# Patient Record
Sex: Female | Born: 1975 | Race: Black or African American | Hispanic: No | Marital: Married | Smoking: Never smoker
Health system: Southern US, Community
[De-identification: ages and names within clinical notes are randomized; demographics above are authoritative.]

## PROBLEM LIST (undated history)

## (undated) HISTORY — PX: ABDOMINAL HYSTERECTOMY: SHX81

---

## 2005-02-04 ENCOUNTER — Other Ambulatory Visit: Admission: RE | Admit: 2005-02-04 | Discharge: 2005-02-04 | Payer: Self-pay | Admitting: Obstetrics and Gynecology

## 2005-10-10 ENCOUNTER — Emergency Department (HOSPITAL_COMMUNITY): Admission: EM | Admit: 2005-10-10 | Discharge: 2005-10-10 | Payer: Self-pay | Admitting: *Deleted

## 2006-10-11 ENCOUNTER — Other Ambulatory Visit: Admission: RE | Admit: 2006-10-11 | Discharge: 2006-10-11 | Payer: Self-pay | Admitting: Obstetrics and Gynecology

## 2006-11-07 ENCOUNTER — Encounter: Admission: RE | Admit: 2006-11-07 | Discharge: 2006-11-07 | Payer: Self-pay | Admitting: Gastroenterology

## 2006-12-21 ENCOUNTER — Encounter (INDEPENDENT_AMBULATORY_CARE_PROVIDER_SITE_OTHER): Payer: Self-pay | Admitting: Obstetrics and Gynecology

## 2006-12-22 ENCOUNTER — Inpatient Hospital Stay (HOSPITAL_COMMUNITY): Admission: RE | Admit: 2006-12-22 | Discharge: 2006-12-23 | Payer: Self-pay | Admitting: Obstetrics and Gynecology

## 2007-03-27 ENCOUNTER — Emergency Department (HOSPITAL_COMMUNITY): Admission: EM | Admit: 2007-03-27 | Discharge: 2007-03-27 | Payer: Self-pay | Admitting: *Deleted

## 2007-04-24 ENCOUNTER — Encounter: Admission: RE | Admit: 2007-04-24 | Discharge: 2007-04-24 | Payer: Self-pay | Admitting: Orthopedic Surgery

## 2007-05-10 ENCOUNTER — Encounter: Admission: RE | Admit: 2007-05-10 | Discharge: 2007-05-24 | Payer: Self-pay | Admitting: Orthopedic Surgery

## 2007-05-25 ENCOUNTER — Encounter: Admission: RE | Admit: 2007-05-25 | Discharge: 2007-06-07 | Payer: Self-pay | Admitting: Orthopedic Surgery

## 2009-06-04 ENCOUNTER — Emergency Department (HOSPITAL_COMMUNITY): Admission: EM | Admit: 2009-06-04 | Discharge: 2009-06-04 | Payer: Self-pay | Admitting: Emergency Medicine

## 2010-10-06 NOTE — Op Note (Signed)
Lydia Ramirez, Lydia Ramirez          ACCOUNT NO.:  [id number]   MEDICAL RECORD NO.:  [id number]          PATIENT TYPE:  OIB   LOCATION:  9307                          FACILITY:  [facility]   PHYSICIAN:  James A. Ashley Royalty, M.D.DATE OF BIRTH:  [birth year]   DATE OF PROCEDURE:  12/21/2006  DATE OF DISCHARGE:                               OPERATIVE REPORT   PREOPERATIVE DIAGNOSIS:  1. Pelvic pain.  2. Dysmenorrhea - severe, unresponsive to medical management.   POSTOPERATIVE DIAGNOSIS:  1. Pelvic pain.  2. Dysmenorrhea - severe, unresponsive to medical management.  3. Path pending.   PROCEDURE:  Laparoscopically assisted vaginal hysterectomy.   SURGEON:  Sylvester Harder, M.D.   ASSISTANT:  Miguel Aschoff, M.D.   ANESTHESIA:  General   ESTIMATED BLOOD LOSS:  200 mL.   PACKS AND DRAINS:  Foley.  Needle, sponge, and instrument counts correct  x2.   PROCEDURE:  The patient was taken to the operating room, placed in the  dorsal supine position.  After the general anesthetic was administered  she was placed in the lithotomy position and prepped and draped in usual  manner for abdominal and vaginal surgery.  Posterior weighted retractor  was placed per vagina.  The Hulka tenaculum was placed on the cervix.  The bladder was drained with a Foley catheter which was left in place  throughout the procedure.   A 1.2 cm infraumbilical incision was made in the transverse plane.  Veress needle was inserted into the abdominal cavity.  Location verified  by instillation of saline and hanging drop techniques.  Approximately 3  liters CO2 were instilled to create a good pneumoperitoneum.  Next size  10/11 disposable laparoscopic trocar was placed into the abdominal  cavity.  The location verified by placement laparoscope.  There is no  evidence of any trauma.  Pneumoperitoneum was maintained throughout with  CO2.  Next two 5 mm ports were placed in the left and right lower  quadrants respectively using  transillumination and direct visualization  techniques.  The pelvis was inspected.  The uterus was normal size,  shape and contour without evidence of any significant fibroids or  endometriosis.  The fallopian tubes were normal size, shape and contour  and length bilaterally.  The left ovary was normal size, shape and  contour without evidence of any cysts or endometriosis.  The right ovary  was surgically absent.  Using the gyrus the round ligaments and  fallopian tubes were cauterized and divided bilaterally.  The utero-  ovarian ligaments were similarly cauterized and divided bilaterally.  Hemostasis was noted.  This point the laparoscopic portion of the  procedure was suspended.   Attention was then turned to the vaginal portion of the procedure.  Posterior weighted retractor was placed per vagina.  A Jacobs tenaculum  was applied to the cervix anteriorly.  Approximately 15 mL of 1%  Xylocaine with epinephrine were instilled circumferentially over the  cervix to create additional hemostasis.  The cervix was then  circumscribed with a knife.  Posterior colpotomy incision was made and  the Steiner-Avard retractor was placed in the posterior cul-de-sac.  The  uterosacral  ligaments were clamped, cut and secured bilaterally with 0  Vicryl.  The anterior cul-de-sac was entered successfully.  Next, the  cardinal ligaments were clamped, cut and secured bilaterally with 0  Vicryl.  The uterine vessels were then clamped, cut and secured  bilaterally with 0 Vicryl.  Next the posterior aspect of corpus was  grasped with a single-tooth tenaculum and the corpus delivered through  the posterior cul-de-sac into the field.  One small strand of tissue was  still noted on the left side and was easily clamped, cut, thus  delivering the surgical specimen for histologic studies.  The pedicle  was secured with 0 Vicryl.  At this point all pedicles were inspected  and found to be hemostatic.  Next a running  locking suture was placed on  the posterior aspect of the vaginal cuff from uterosacral ligament to  uterosacral ligament.  Hemostasis was noted.  The vagina was then closed  anteriorly to posteriorly using 0 Vicryl and a figure-of-eight fashion.  Approximately four such sutures were placed.  Hemostasis was noted.   Attention was then turned to the laparoscopic portion of the procedure.  The pneumoperitoneum was recreated and the field visualized  laparoscopically.  Using the suction irrigator copious irrigation was  accomplished.  A couple small oozing sites in cuff area were easily  controlled with bipolar cautery.  Additional irrigation was accomplished  and hemostasis noted.  This point the patient was felt to have benefited  maximally from the surgical procedure.   The abdominal instruments were then removed and pneumoperitoneum  evacuated.  The umbilical incision was closed with #1 Vicryl in the  fascia in interrupted fashion.  The skin was closed with 3-0 Monocryl in  subcuticular fashion.  Approximately 20 mL of 0.5% Marcaine with  1:200,000 epinephrine were instilled in the surgical incisions to aid in  postoperative analgesia.   The patient tolerated the procedure extremely well and was returned to  the recovery room in good condition.      James A. Ashley Royalty, M.D.  Electronically Signed     JAM/MEDQ  D:  12/21/2006  T:  12/22/2006  Job:  [id number]

## 2010-10-06 NOTE — H&P (Signed)
Lydia Ramirez, VERHOEVEN          ACCOUNT NO.:  [id number]   MEDICAL RECORD NO.:  [id number]          PATIENT TYPE:  AMB   LOCATION:  SDC                           [facility]ACILITY:  [facility]   PHYSICIAN:  James A. Ashley Royalty, M.D.DATE OF BIRTH:  26-Apr-1976   DATE OF ADMISSION:  DATE OF DISCHARGE:                              HISTORY & PHYSICAL   This is a 35 year old gravida 2, para 2 who presented to me May 2008  complaining of pelvic pain, right greater than left, as well as severe  dysmenorrhea. The duration was approximately three months. The pain  seemed related to her cycle, occurring mostly just prior to and during  the menstrual cycle. Subsequent ultrasound revealed the right adnexa to  be surgically absent. The left adnexa contained only a cystic follicle.  The uterus appeared normal. The patient returned in June stating that  her periods were continuing to be quite debilitating and unresponsive to  medical management. She stated a desire for definitive management in the  form of hysterectomy. She confirmed that her right ovary had been  previously removed but was unsure about status of her right fallopian  tube.   MEDICATIONS:  Darvocet.   PAST MEDICAL HISTORY:  Medical:  Negative. Surgical:  Diagnostic  laparoscopy.   ALLERGIES:  None.   FAMILY HISTORY:  Noncontributory.   SOCIAL HISTORY:  The patient denies use of tobacco or significant  alcohol.   REVIEW OF SYSTEMS:  Noncontributory.   PHYSICAL EXAMINATION:  Well-developed, well-nourished, pleasant female  in no acute distress. Afebrile. Vital signs stable.  CHEST:  Lungs were clear.  CARDIAC:  Regular rate and rhythm.  ABDOMEN:  Soft and nontender.  MUSCULOSKELETAL:  No edema.  PELVIC:  Please see most recent office evaluation. External genitalia  within normal limits. Vagina and cervix were without gross lesions.  Bimanual examination reveals uterus to be approximately 8 x 4 x 4 cm. No  adnexal masses were  palpable.   IMPRESSION:  1. Status post right oophorectomy.  2. Pelvic pain and dysmenorrhea - debilitating.   PLAN:  Laparoscopically assisted vaginal hysterectomy with possible left  salpingo-oophorectomy and possible right salpingectomy. Risks, benefits,  complications and alternatives fully discussed. The patient states in  the event of bilateral oophorectomy she would become a candidate for  hormone-replacement therapy and agrees to same. She understands the  possibility of exploratory laparotomy and agrees to same. Questions  invited and answered.      James A. Ashley Royalty, M.D.  Electronically Signed     JAM/MEDQ  D:  12/21/2006  T:  12/21/2006  Job:  [id number]

## 2010-10-09 NOTE — Discharge Summary (Signed)
NAMEMICHELLE, WNEK          ACCOUNT NO.:  [id number]   MEDICAL RECORD NO.:  [id number]          PATIENT TYPE:  INP   LOCATION:  9307                          [facility]ACILITY:  [facility]   PHYSICIAN:  Rudy Jew. Ashley Royalty, M.D.DATE OF BIRTH:  1975/07/12   DATE OF ADMISSION:  12/21/2006  DATE OF DISCHARGE:  12/23/2006                               DISCHARGE SUMMARY   DISCHARGE DIAGNOSES:  1. Endocervical gland hyperplasia.  2. Dysmenorrhea - severe, unresponsive to medical management.   OPERATIONS/PROCEDURES:  Laparoscopically-assisted vaginal hysterectomy.   CONSULTATIONS:  None.   DISCHARGE MEDICATIONS:  1. Percocet.  2. Motrin 600 mg.   HISTORY AND PHYSICAL:  This is a 35 year old gravida 2, para 2 who  presented to me in May of 2008 complaining of pelvic pain, right greater  than left, as well as severe dysmenorrhea.  Subsequent ultrasound  revealed the right adnexa to be surgically absent and the left adnexa to  contain only a cystic follicle.  The uterus appeared normal.  The  patient returned in June stating that her periods were continuing to be  quite debilitating and unresponsive to medical management.  She stated a  desire for definitive management in the form of hysterectomy.  For the  remainder of the history and physical, please see chart.   HOSPITAL COURSE:  The patient was admitted to [facility] of  [place].  Admission laboratory studies were drawn.  On December 21, 2006,  she was taken to the operating room and underwent laparoscopically-  assisted vaginal hysterectomy.  The procedure was accomplished by Dr.  Sylvester Harder with Dr. Tenny Craw assisting.  The patient's postoperative  course was benign.  She was discharged on the second postoperative day  afebrile and in satisfactory condition.   DISPOSITION:  The patient is to return to [facility] in 6 weeks for a postoperative evaluation.      James A. Ashley Royalty, M.D.  Electronically  Signed     JAM/MEDQ  D:  01/24/2007  T:  01/24/2007  Job:  [id number]

## 2011-03-08 LAB — CBC
HCT: 32.1 — ABNORMAL LOW
HCT: 36.1
Hemoglobin: 10.5 — ABNORMAL LOW
MCHC: 32.7
MCV: 81.5
MCV: 82.7
Platelets: 190
RBC: 4.43
RDW: 14.4 — ABNORMAL HIGH
WBC: 4.2
WBC: 9.2

## 2011-03-08 LAB — TYPE AND SCREEN
ABO/RH(D): O POS
Antibody Screen: NEGATIVE

## 2011-03-08 LAB — ABO/RH: ABO/RH(D): O POS

## 2011-03-08 LAB — HCG, SERUM, QUALITATIVE: Preg, Serum: NEGATIVE

## 2011-12-01 ENCOUNTER — Other Ambulatory Visit: Payer: Self-pay | Admitting: Internal Medicine

## 2011-12-01 DIAGNOSIS — Z1231 Encounter for screening mammogram for malignant neoplasm of breast: Secondary | ICD-10-CM

## 2011-12-07 ENCOUNTER — Ambulatory Visit
Admission: RE | Admit: 2011-12-07 | Discharge: 2011-12-07 | Disposition: A | Discharge status: Home / Self Care | Payer: 59 | Source: Ambulatory Visit | Attending: Internal Medicine | Admitting: Internal Medicine

## 2011-12-07 DIAGNOSIS — Z1231 Encounter for screening mammogram for malignant neoplasm of breast: Secondary | ICD-10-CM

## 2011-12-14 ENCOUNTER — Other Ambulatory Visit: Payer: Self-pay | Admitting: Internal Medicine

## 2011-12-14 DIAGNOSIS — R928 Other abnormal and inconclusive findings on diagnostic imaging of breast: Secondary | ICD-10-CM

## 2011-12-16 ENCOUNTER — Ambulatory Visit
Admission: RE | Admit: 2011-12-16 | Discharge: 2011-12-16 | Disposition: A | Discharge status: Home / Self Care | Payer: 59 | Source: Ambulatory Visit | Attending: Internal Medicine | Admitting: Internal Medicine

## 2011-12-16 DIAGNOSIS — R928 Other abnormal and inconclusive findings on diagnostic imaging of breast: Secondary | ICD-10-CM

## 2012-10-25 ENCOUNTER — Ambulatory Visit
Admission: RE | Admit: 2012-10-25 | Discharge: 2012-10-25 | Disposition: A | Discharge status: Home / Self Care | Payer: 59 | Source: Ambulatory Visit | Attending: Family | Admitting: Family

## 2012-10-25 ENCOUNTER — Other Ambulatory Visit: Payer: Self-pay | Admitting: Family

## 2012-10-25 DIAGNOSIS — S6991XA Unspecified injury of right wrist, hand and finger(s), initial encounter: Secondary | ICD-10-CM

## 2014-10-29 ENCOUNTER — Other Ambulatory Visit: Payer: Self-pay | Admitting: Family Medicine

## 2014-10-29 ENCOUNTER — Other Ambulatory Visit (HOSPITAL_COMMUNITY)
Admission: RE | Admit: 2014-10-29 | Discharge: 2014-10-29 | Disposition: A | Discharge status: Home / Self Care | Payer: BLUE CROSS/BLUE SHIELD | Source: Ambulatory Visit | Attending: Family Medicine | Admitting: Family Medicine

## 2014-10-29 DIAGNOSIS — Z1151 Encounter for screening for human papillomavirus (HPV): Secondary | ICD-10-CM | POA: Diagnosis present

## 2014-10-29 DIAGNOSIS — Z01419 Encounter for gynecological examination (general) (routine) without abnormal findings: Secondary | ICD-10-CM | POA: Diagnosis not present

## 2014-10-30 ENCOUNTER — Other Ambulatory Visit: Payer: Self-pay | Admitting: Family Medicine

## 2014-10-30 DIAGNOSIS — M7989 Other specified soft tissue disorders: Secondary | ICD-10-CM

## 2014-10-31 LAB — CYTOLOGY - PAP

## 2014-11-01 ENCOUNTER — Other Ambulatory Visit: Payer: Self-pay | Admitting: Family Medicine

## 2014-11-01 DIAGNOSIS — N644 Mastodynia: Secondary | ICD-10-CM

## 2014-11-06 ENCOUNTER — Ambulatory Visit
Admission: RE | Admit: 2014-11-06 | Discharge: 2014-11-06 | Disposition: A | Discharge status: Home / Self Care | Payer: BLUE CROSS/BLUE SHIELD | Source: Ambulatory Visit | Attending: Family Medicine | Admitting: Family Medicine

## 2014-11-06 DIAGNOSIS — M7989 Other specified soft tissue disorders: Secondary | ICD-10-CM

## 2014-11-08 ENCOUNTER — Ambulatory Visit
Admission: RE | Admit: 2014-11-08 | Discharge: 2014-11-08 | Disposition: A | Discharge status: Home / Self Care | Payer: BLUE CROSS/BLUE SHIELD | Source: Ambulatory Visit | Attending: Family Medicine | Admitting: Family Medicine

## 2014-11-08 DIAGNOSIS — N644 Mastodynia: Secondary | ICD-10-CM

## 2015-03-06 ENCOUNTER — Other Ambulatory Visit: Payer: Self-pay | Admitting: Family Medicine

## 2015-03-06 DIAGNOSIS — R109 Unspecified abdominal pain: Secondary | ICD-10-CM

## 2015-03-12 ENCOUNTER — Other Ambulatory Visit: Payer: Self-pay | Admitting: Family Medicine

## 2015-03-12 DIAGNOSIS — K7689 Other specified diseases of liver: Secondary | ICD-10-CM

## 2017-07-29 DIAGNOSIS — J069 Acute upper respiratory infection, unspecified: Secondary | ICD-10-CM | POA: Diagnosis not present

## 2017-07-29 DIAGNOSIS — J209 Acute bronchitis, unspecified: Secondary | ICD-10-CM | POA: Diagnosis not present

## 2017-08-30 DIAGNOSIS — T7840XA Allergy, unspecified, initial encounter: Secondary | ICD-10-CM | POA: Diagnosis not present

## 2017-08-30 DIAGNOSIS — L299 Pruritus, unspecified: Secondary | ICD-10-CM | POA: Diagnosis not present

## 2017-10-14 DIAGNOSIS — N898 Other specified noninflammatory disorders of vagina: Secondary | ICD-10-CM | POA: Diagnosis not present

## 2017-10-14 DIAGNOSIS — M62838 Other muscle spasm: Secondary | ICD-10-CM | POA: Diagnosis not present

## 2017-10-14 DIAGNOSIS — R102 Pelvic and perineal pain: Secondary | ICD-10-CM | POA: Diagnosis not present

## 2018-01-06 DIAGNOSIS — Z6838 Body mass index (BMI) 38.0-38.9, adult: Secondary | ICD-10-CM | POA: Diagnosis not present

## 2018-01-06 DIAGNOSIS — E669 Obesity, unspecified: Secondary | ICD-10-CM | POA: Diagnosis not present

## 2018-04-17 DIAGNOSIS — E669 Obesity, unspecified: Secondary | ICD-10-CM | POA: Diagnosis not present

## 2018-04-17 DIAGNOSIS — Z713 Dietary counseling and surveillance: Secondary | ICD-10-CM | POA: Diagnosis not present

## 2018-05-21 DIAGNOSIS — M7541 Impingement syndrome of right shoulder: Secondary | ICD-10-CM | POA: Diagnosis not present

## 2018-05-30 DIAGNOSIS — M25511 Pain in right shoulder: Secondary | ICD-10-CM | POA: Diagnosis not present

## 2018-07-10 DIAGNOSIS — M62838 Other muscle spasm: Secondary | ICD-10-CM | POA: Diagnosis not present

## 2018-07-10 DIAGNOSIS — R05 Cough: Secondary | ICD-10-CM | POA: Diagnosis not present

## 2018-07-13 ENCOUNTER — Other Ambulatory Visit: Payer: Self-pay | Admitting: Family Medicine

## 2018-07-13 DIAGNOSIS — Z1231 Encounter for screening mammogram for malignant neoplasm of breast: Secondary | ICD-10-CM

## 2018-07-17 ENCOUNTER — Ambulatory Visit
Admission: RE | Admit: 2018-07-17 | Discharge: 2018-07-17 | Disposition: A | Discharge status: Home / Self Care | Payer: 59 | Source: Ambulatory Visit | Attending: Family Medicine | Admitting: Family Medicine

## 2018-07-17 ENCOUNTER — Encounter: Payer: Self-pay | Admitting: Radiology

## 2018-07-17 DIAGNOSIS — Z1231 Encounter for screening mammogram for malignant neoplasm of breast: Secondary | ICD-10-CM

## 2018-07-18 ENCOUNTER — Other Ambulatory Visit: Payer: Self-pay | Admitting: Family Medicine

## 2018-07-18 DIAGNOSIS — R928 Other abnormal and inconclusive findings on diagnostic imaging of breast: Secondary | ICD-10-CM

## 2018-07-21 ENCOUNTER — Ambulatory Visit
Admission: RE | Admit: 2018-07-21 | Discharge: 2018-07-21 | Disposition: A | Discharge status: Home / Self Care | Payer: 59 | Source: Ambulatory Visit | Attending: Family Medicine | Admitting: Family Medicine

## 2018-07-21 DIAGNOSIS — R928 Other abnormal and inconclusive findings on diagnostic imaging of breast: Secondary | ICD-10-CM

## 2018-07-21 DIAGNOSIS — N6002 Solitary cyst of left breast: Secondary | ICD-10-CM | POA: Diagnosis not present

## 2019-05-04 ENCOUNTER — Emergency Department (HOSPITAL_COMMUNITY): Payer: Managed Care, Other (non HMO)

## 2019-05-04 ENCOUNTER — Emergency Department (HOSPITAL_COMMUNITY)
Admission: EM | Admit: 2019-05-04 | Discharge: 2019-05-04 | Disposition: A | Discharge status: Home / Self Care | Payer: Managed Care, Other (non HMO) | Attending: Emergency Medicine | Admitting: Emergency Medicine

## 2019-05-04 ENCOUNTER — Other Ambulatory Visit: Payer: Self-pay

## 2019-05-04 ENCOUNTER — Encounter (HOSPITAL_COMMUNITY): Payer: Self-pay | Admitting: Emergency Medicine

## 2019-05-04 DIAGNOSIS — Z20828 Contact with and (suspected) exposure to other viral communicable diseases: Secondary | ICD-10-CM | POA: Insufficient documentation

## 2019-05-04 DIAGNOSIS — X58XXXA Exposure to other specified factors, initial encounter: Secondary | ICD-10-CM | POA: Insufficient documentation

## 2019-05-04 DIAGNOSIS — Y929 Unspecified place or not applicable: Secondary | ICD-10-CM | POA: Diagnosis not present

## 2019-05-04 DIAGNOSIS — R079 Chest pain, unspecified: Secondary | ICD-10-CM

## 2019-05-04 DIAGNOSIS — S29011A Strain of muscle and tendon of front wall of thorax, initial encounter: Secondary | ICD-10-CM | POA: Diagnosis not present

## 2019-05-04 DIAGNOSIS — Y999 Unspecified external cause status: Secondary | ICD-10-CM | POA: Insufficient documentation

## 2019-05-04 DIAGNOSIS — R0789 Other chest pain: Secondary | ICD-10-CM | POA: Insufficient documentation

## 2019-05-04 DIAGNOSIS — Y939 Activity, unspecified: Secondary | ICD-10-CM | POA: Diagnosis not present

## 2019-05-04 LAB — BASIC METABOLIC PANEL
Anion gap: 9 (ref 5–15)
BUN: 11 mg/dL (ref 6–20)
CO2: 24 mmol/L (ref 22–32)
Calcium: 11.1 mg/dL — ABNORMAL HIGH (ref 8.9–10.3)
Chloride: 105 mmol/L (ref 98–111)
Creatinine, Ser: 0.89 mg/dL (ref 0.44–1.00)
GFR calc Af Amer: >60 mL/min (ref >60)
GFR calc non Af Amer: >60 mL/min (ref >60)
Glucose, Bld: 111 mg/dL — ABNORMAL HIGH (ref 70–99)
Potassium: 4.4 mmol/L (ref 3.5–5.1)
Sodium: 138 mmol/L (ref 135–145)

## 2019-05-04 LAB — HEPATIC FUNCTION PANEL
ALT: 26 U/L (ref 0–44)
AST: 24 U/L (ref 15–41)
Albumin: 4.2 g/dL (ref 3.5–5.0)
Alkaline Phosphatase: 80 U/L (ref 38–126)
Bilirubin, Direct: <0.1 mg/dL (ref 0.0–0.2)
Total Bilirubin: 0.8 mg/dL (ref 0.3–1.2)
Total Protein: 7.9 g/dL (ref 6.5–8.1)

## 2019-05-04 LAB — CBC
HCT: 45.5 % (ref 36.0–46.0)
Hemoglobin: 14.4 g/dL (ref 12.0–15.0)
MCH: 26.2 pg (ref 26.0–34.0)
MCHC: 31.6 g/dL (ref 30.0–36.0)
MCV: 82.7 fL (ref 80.0–100.0)
Platelets: 242 10*3/uL (ref 150–400)
RBC: 5.5 MIL/uL — ABNORMAL HIGH (ref 3.87–5.11)
RDW: 13.5 % (ref 11.5–15.5)
WBC: 6.8 10*3/uL (ref 4.0–10.5)
nRBC: 0 % (ref 0.0–0.2)

## 2019-05-04 LAB — TROPONIN I (HIGH SENSITIVITY)
Troponin I (High Sensitivity): 2 ng/L (ref <18)
Troponin I (High Sensitivity): 4 ng/L (ref <18)

## 2019-05-04 LAB — D-DIMER, QUANTITATIVE: D-Dimer, Quant: 0.35 ug/mL-FEU (ref 0.00–0.50)

## 2019-05-04 LAB — LIPASE, BLOOD: Lipase: 21 U/L (ref 11–51)

## 2019-05-04 MED ORDER — FENTANYL CITRATE (PF) 100 MCG/2ML IJ SOLN
50.0000 ug | Freq: Once | INTRAMUSCULAR | Status: AC
  Administered 2019-05-04: 50 ug via INTRAVENOUS
  Filled 2019-05-04: qty 2

## 2019-05-04 MED ORDER — CYCLOBENZAPRINE HCL 5 MG PO TABS: 5.0000 mg | ORAL_TABLET | Freq: Three times a day (TID) | ORAL | 0 refills | Status: AC | PRN

## 2019-05-04 MED ORDER — KETOROLAC TROMETHAMINE 10 MG PO TABS: 10.0000 mg | ORAL_TABLET | Freq: Four times a day (QID) | ORAL | 0 refills | Status: AC | PRN

## 2019-05-04 MED ORDER — KETOROLAC TROMETHAMINE 30 MG/ML IJ SOLN
30.0000 mg | Freq: Once | INTRAMUSCULAR | Status: AC
  Administered 2019-05-04: 30 mg via INTRAVENOUS
  Filled 2019-05-04: qty 1

## 2019-05-04 NOTE — ED Provider Notes (Signed)
[facility] EMERGENCY DEPARTMENT Provider Note   CSN: [id number] Arrival date & time: 05/04/19  1123     History Chief Complaint  Patient presents with  . Chest Pain    Lydia Ramirez is a 43 y.o. female.  HPI Patient presents with right-sided chest pain.  Sharp.  Worse with breathing.  Began last night and thought it was may be indigestion.  Worse with movements worse with deep breathing.  No nausea or vomiting.  No fevers.  Has not had pains like this before.  No swelling in her legs.  She does not smoke and does not on birth control pills.  No recent travel.  No cough.  States it hurts to breathe.    History reviewed. No pertinent past medical history.  There are no problems to display for this patient.   Past Surgical History:  Procedure Laterality Date  . ABDOMINAL HYSTERECTOMY       OB History   No obstetric history on file.     Family History  Problem Relation Age of Onset  . Breast cancer Maternal Aunt     Social History   Tobacco Use  . Smoking status: Never Smoker  . Smokeless tobacco: Never Used  Substance Use Topics  . Alcohol use: Not on file  . Drug use: Never    Home Medications Prior to Admission medications   Not on File    Allergies    Patient has no known allergies.  Review of Systems   Review of Systems  Physical Exam Updated Vital Signs BP (!) 155/96 (BP Location: Left Arm)   Pulse 92   Temp 98.5 F (36.9 C) (Oral)   Resp 18   SpO2 100%   Physical Exam  ED Results / Procedures / Treatments   Labs (all labs ordered are listed, but only abnormal results are displayed) Labs Reviewed  BASIC METABOLIC PANEL - Abnormal; Notable for the following components:      Result Value   Glucose, Bld 111 (*)    Calcium 11.1 (*)    All other components within normal limits  CBC - Abnormal; Notable for the following components:   RBC 5.50 (*)    All other components within normal limits  D-DIMER,  QUANTITATIVE (NOT AT [facility])  HEPATIC FUNCTION PANEL  TROPONIN I (HIGH SENSITIVITY)  TROPONIN I (HIGH SENSITIVITY)    EKG EKG Interpretation  Date/Time:  Friday May 04 2019 11:28:01 EST Ventricular Rate:  91 PR Interval:  128 QRS Duration: 74 QT Interval:  336 QTC Calculation: 413 R Axis:   66 Text Interpretation: Normal sinus rhythm Nonspecific T wave abnormality Abnormal ECG No old tracing to compare Confirmed by Davonna Belling [phone]) on 05/04/2019 1:19:33 PM   Radiology No results found.  Procedures Procedures (including critical care time)  Medications Ordered in ED Medications  ketorolac (TORADOL) 30 MG/ML injection 30 mg (has no administration in time range)    ED Course  I have reviewed the triage vital signs and the nursing notes.  Pertinent labs & imaging results that were available during my care of the patient were reviewed by me and considered in my medical decision making (see chart for details).    MDM Rules/Calculators/A&P     CHA2DS2/VAS Stroke Risk Points      N/A >= 2 Points: High Risk  1 - 1.99 Points: Medium Risk  0 Points: Low Risk    A final score could not be computed because of missing components.:  Last  Change: N/A     This score determines the patient's risk of having a stroke if the  patient has atrial fibrillation.      This score is not applicable to this patient. Components are not  calculated.                   Patient with sharp chest pain there is upper abdominal pain.  X-ray reportedly reassuring.  Shortness of breath.  Lab work reassuring.  D-dimer negative.  Hepatic function negative.  Care turned over to Dr. Stevie Kern. Final Clinical Impression(s) / ED Diagnoses Final diagnoses:  None    Rx / DC Orders ED Discharge Orders    None       Benjiman Core, MD 05/04/19 1553

## 2019-05-04 NOTE — ED Notes (Signed)
Pt ambulated to bathroom, but brought back to room in w/c-- severe pain with ambulating, shortness of breath- taking shallow breaths--

## 2019-05-04 NOTE — ED Notes (Signed)
Patient states already had chest x-ray P[facility] at [facility].

## 2019-05-04 NOTE — Discharge Instructions (Signed)
Recommend taking anti-inflammatory such as the prescribed Toradol.  Can also take Tylenol and the prescribed muscle relaxer.  If you develop worsening chest pain, difficulty breathing, passing out, other new concerning symptom, recommend return to ER for reassessment.

## 2019-05-04 NOTE — ED Triage Notes (Signed)
Patient c/o right sided chest pain underneath her breast onset of yesterday afternoon. Patient states pain started as dull and she thought it was indigestion but pain has continued and worsened. Patient appears uncomfortable. Pain is non radiating and worse with inspiration.

## 2019-05-04 NOTE — ED Provider Notes (Signed)
Signout note  Summary: 43 year old lady presents to ER with new onset right-sided chest pain since yesterday.  CXR urgent care unremarkable, EKG without ischemic changes, normal vital signs here.  Labs including troponin and dimer pending.  3:13 PM Received signout from Dr. Alvino Chapel, follow-up labs  3:45 PM reviewed labs, recheck patient, persistent right lower chest pain, question some right upper quadrant pain, will check CXR here as well as right upper quadrant ultrasound, add lipase  5:21 PM  Recheck patient, pain has resolved, she is well-appearing with normal vital signs, reviewed results, no definite cause for her chest pain today, based on symptomatology suspect MSK, very reproducible under right breast on right chest wall.  EKG without acute ischemic changes, troponin x2 within normal limits, no significant delta troponin, D-dimer negative, CXR negative, right upper quadrant ultrasound negative, LFTs normal, lipase normal.  Reviewed return precautions, will give Rx for muscle relaxer and NSAID, recommend recheck with PCP.    After the discussed management above, the patient was determined to be safe for discharge.  The patient was in agreement with this plan and all questions regarding their care were answered.  ED return precautions were discussed and the patient will return to the ED with any significant worsening of condition.     Lucrezia Starch, MD 05/04/19 1721

## 2019-05-05 LAB — NOVEL CORONAVIRUS, NAA (HOSP ORDER, SEND-OUT TO REF LAB; TAT 18-24 HRS): SARS-CoV-2, NAA: NOT DETECTED

## 2020-09-04 ENCOUNTER — Ambulatory Visit: Payer: Managed Care, Other (non HMO) | Admitting: Physical Therapy

## 2020-09-11 ENCOUNTER — Other Ambulatory Visit: Payer: Self-pay

## 2020-09-11 ENCOUNTER — Other Ambulatory Visit (HOSPITAL_COMMUNITY): Payer: Self-pay | Admitting: Family Medicine

## 2020-09-11 ENCOUNTER — Ambulatory Visit (HOSPITAL_COMMUNITY)
Admission: RE | Admit: 2020-09-11 | Discharge: 2020-09-11 | Disposition: A | Discharge status: Home / Self Care | Payer: Managed Care, Other (non HMO) | Source: Ambulatory Visit | Attending: Family Medicine | Admitting: Family Medicine

## 2020-09-11 DIAGNOSIS — M79662 Pain in left lower leg: Secondary | ICD-10-CM | POA: Diagnosis not present

## 2020-09-11 DIAGNOSIS — M7989 Other specified soft tissue disorders: Secondary | ICD-10-CM

## 2020-09-24 ENCOUNTER — Ambulatory Visit
Admission: RE | Admit: 2020-09-24 | Discharge: 2020-09-24 | Disposition: A | Discharge status: Home / Self Care | Payer: Managed Care, Other (non HMO) | Source: Ambulatory Visit | Attending: Family Medicine | Admitting: Family Medicine

## 2020-09-24 ENCOUNTER — Other Ambulatory Visit: Payer: Self-pay | Admitting: Family Medicine

## 2020-09-24 ENCOUNTER — Other Ambulatory Visit: Payer: Self-pay

## 2020-09-24 DIAGNOSIS — M79672 Pain in left foot: Secondary | ICD-10-CM

## 2020-09-24 DIAGNOSIS — M25551 Pain in right hip: Secondary | ICD-10-CM

## 2020-09-24 DIAGNOSIS — M25562 Pain in left knee: Secondary | ICD-10-CM

## 2022-04-09 ENCOUNTER — Other Ambulatory Visit: Payer: Self-pay | Admitting: Family Medicine

## 2022-04-09 DIAGNOSIS — Z1231 Encounter for screening mammogram for malignant neoplasm of breast: Secondary | ICD-10-CM

## 2022-07-18 IMAGING — CR DG HIP (WITH OR WITHOUT PELVIS) 2-3V*R*
2 series · 2 of 2 positions shown · non-contrast
Comparison: None.

CLINICAL DATA: Right hip, knee and foot pain for 2 months since the
patient head have a misstep off a curb. Initial encounter.

EXAM:
DG HIP (WITH OR WITHOUT PELVIS) 2-3V RIGHT

[w hip ap right]
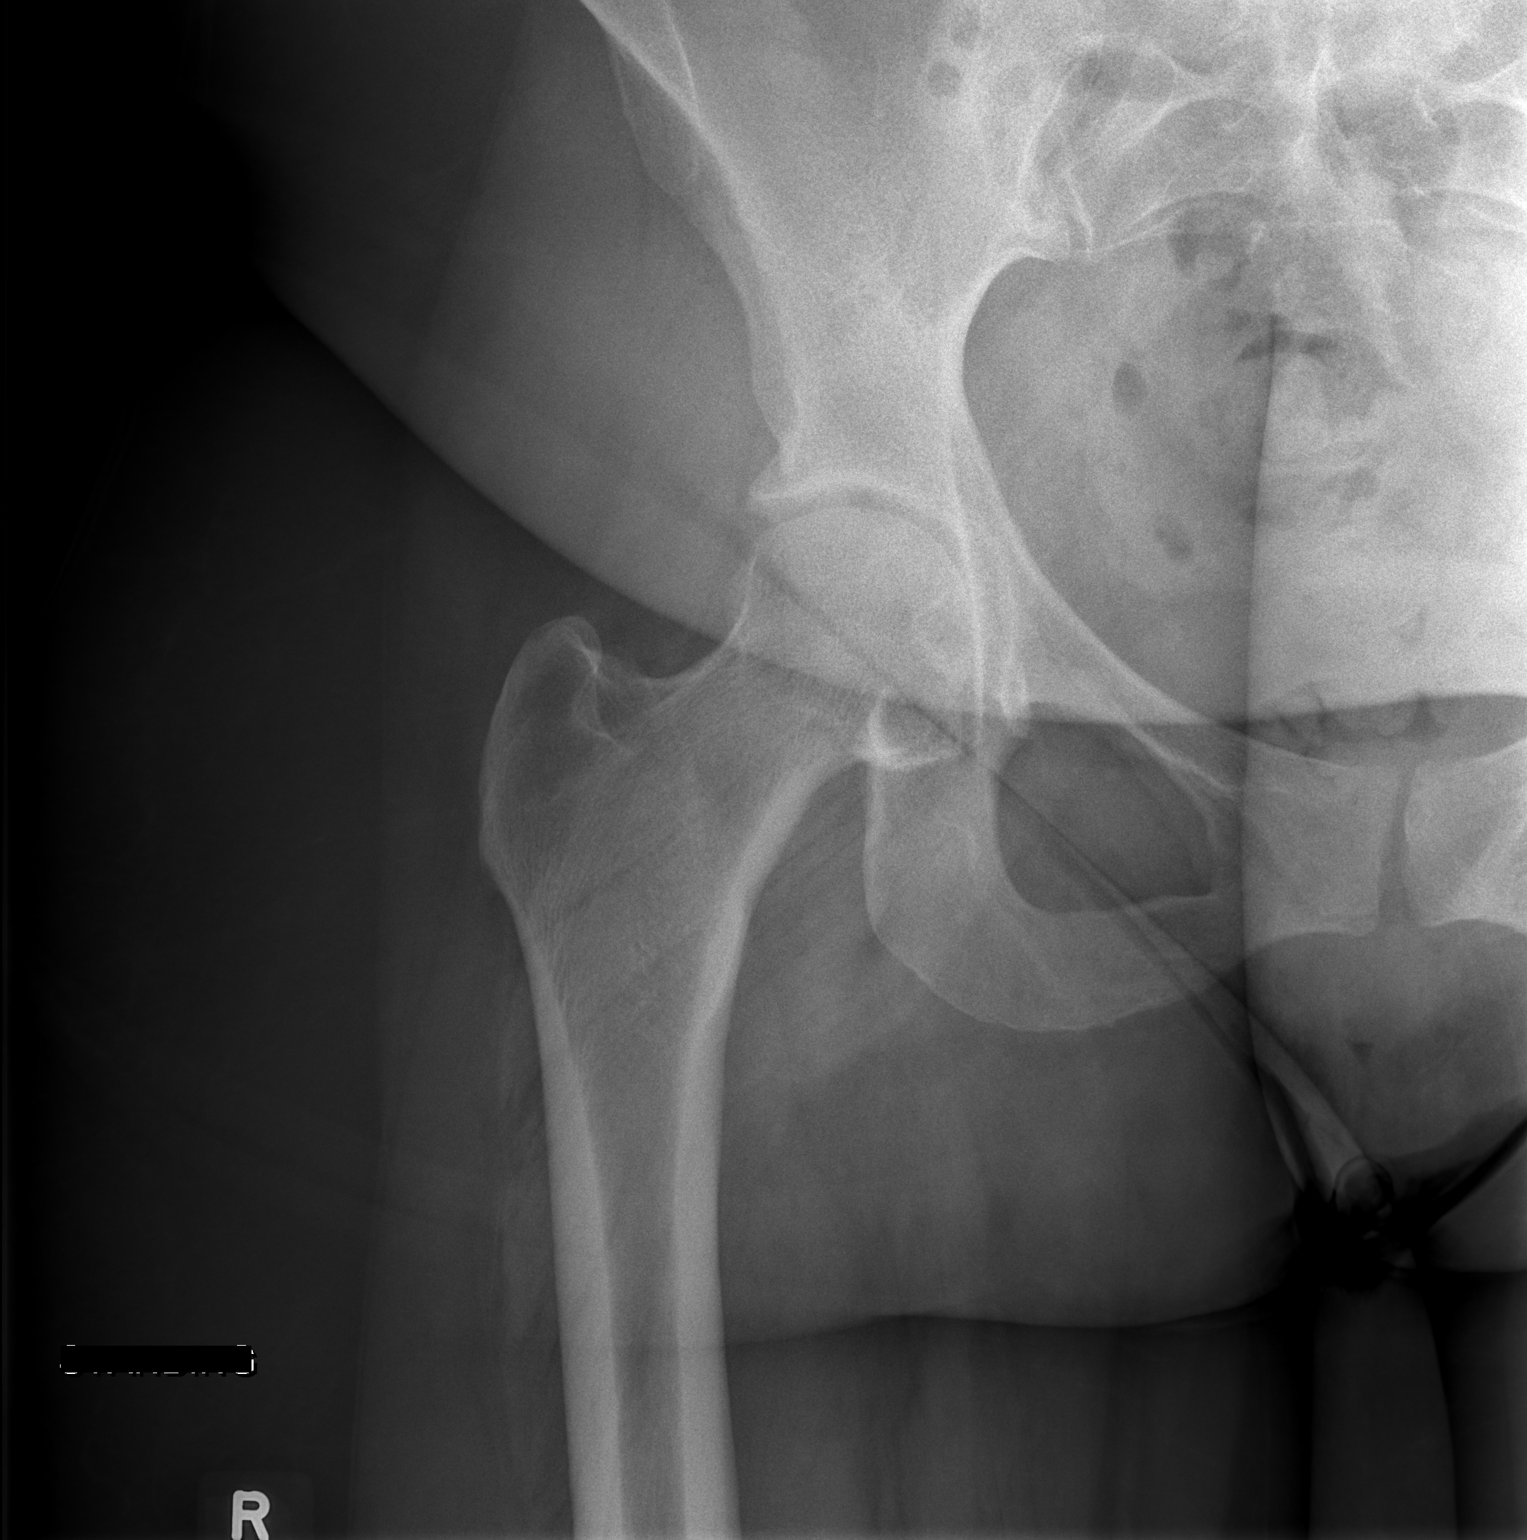

[w hip frog right]
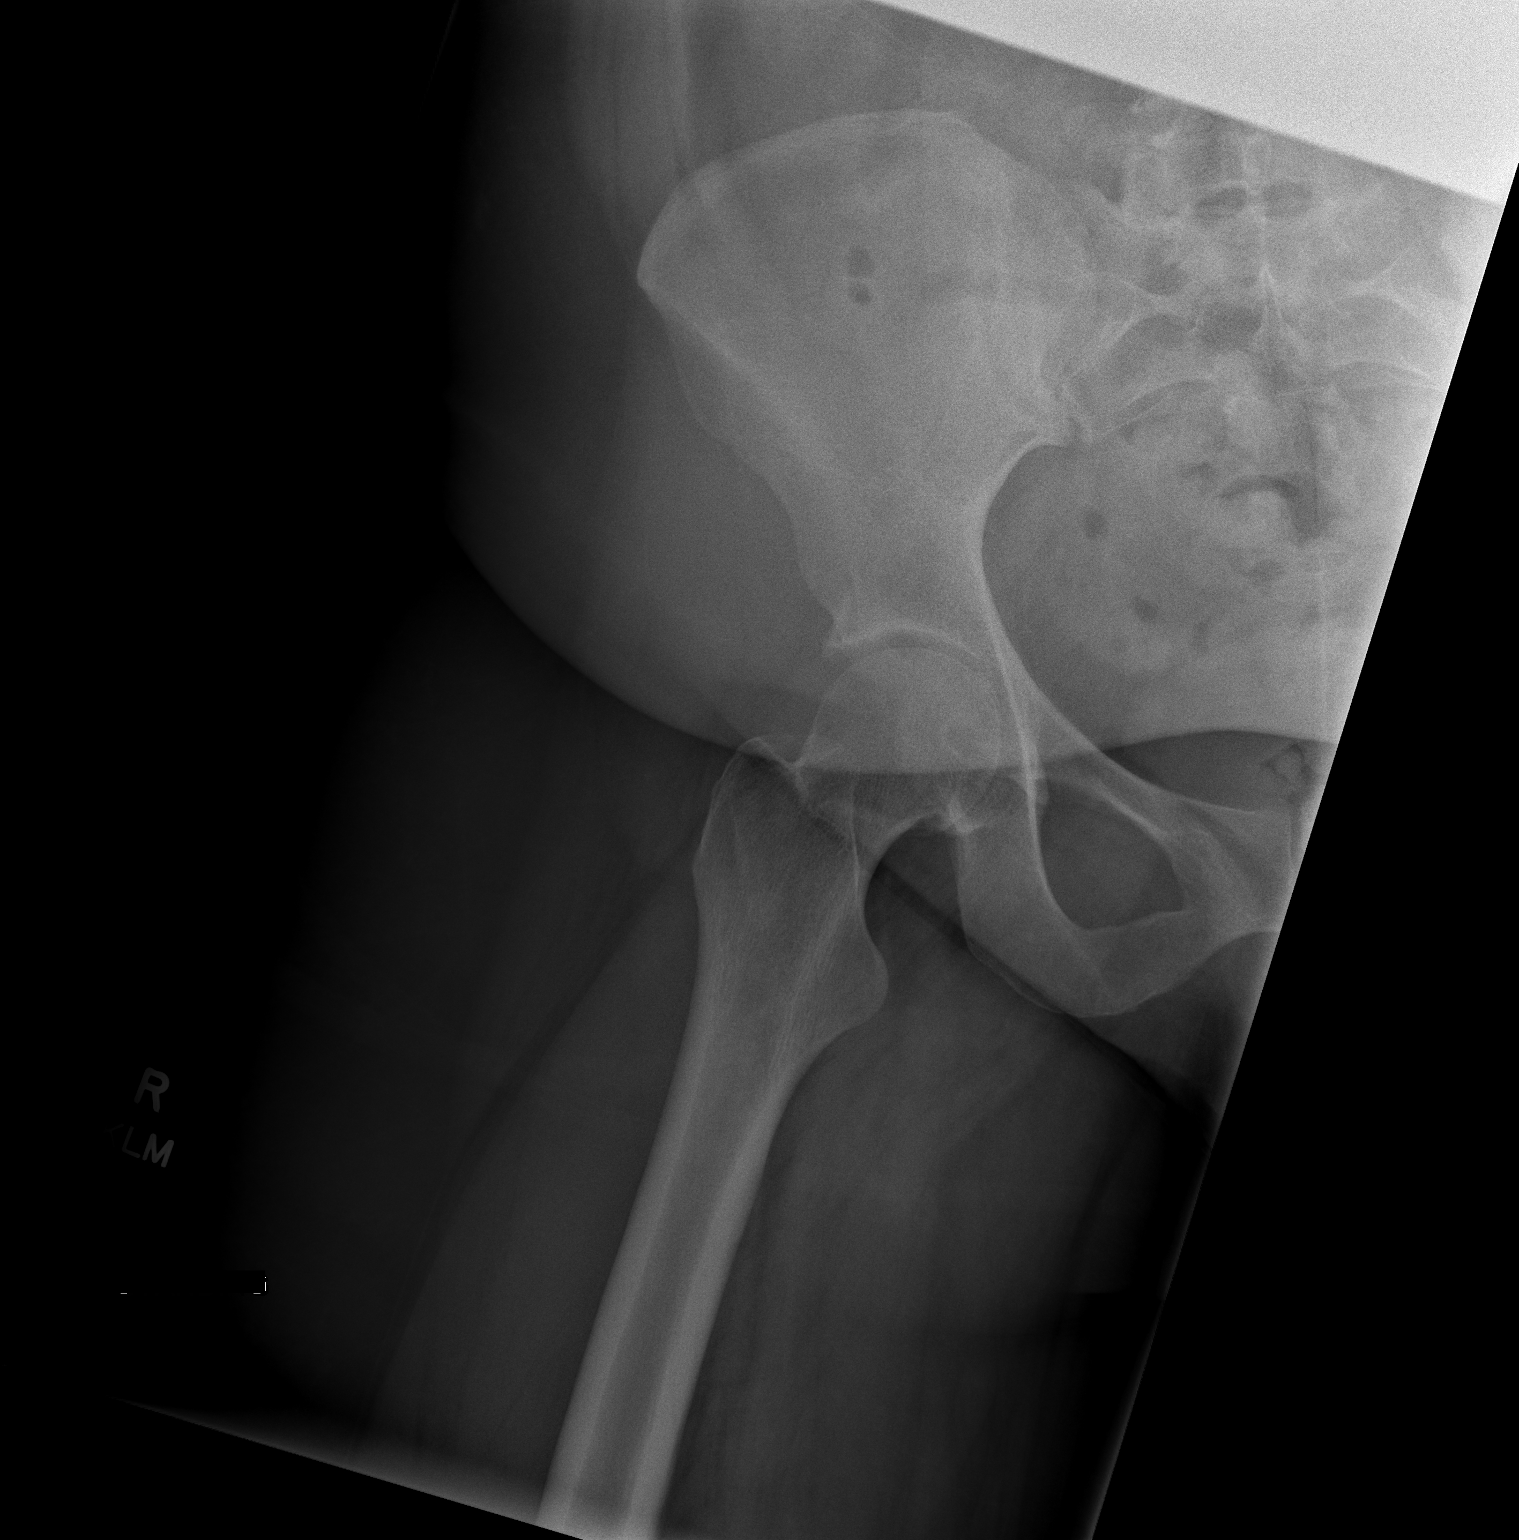

[2 of 2 positions shown; findings below may reference images not displayed]

FINDINGS: There is no evidence of hip fracture or dislocation. There is no
evidence of arthropathy or other focal bone abnormality.
IMPRESSION: Normal exam.

## 2022-07-18 IMAGING — CR DG FOOT 2V*L*
2 series · 2 of 2 positions shown · non-contrast
Comparison: None.

CLINICAL DATA: Left foot pain for 2 months since the patient had a
misstep on a curb. Initial encounter.

EXAM:
LEFT FOOT - 2 VIEW

[x foot ap left]
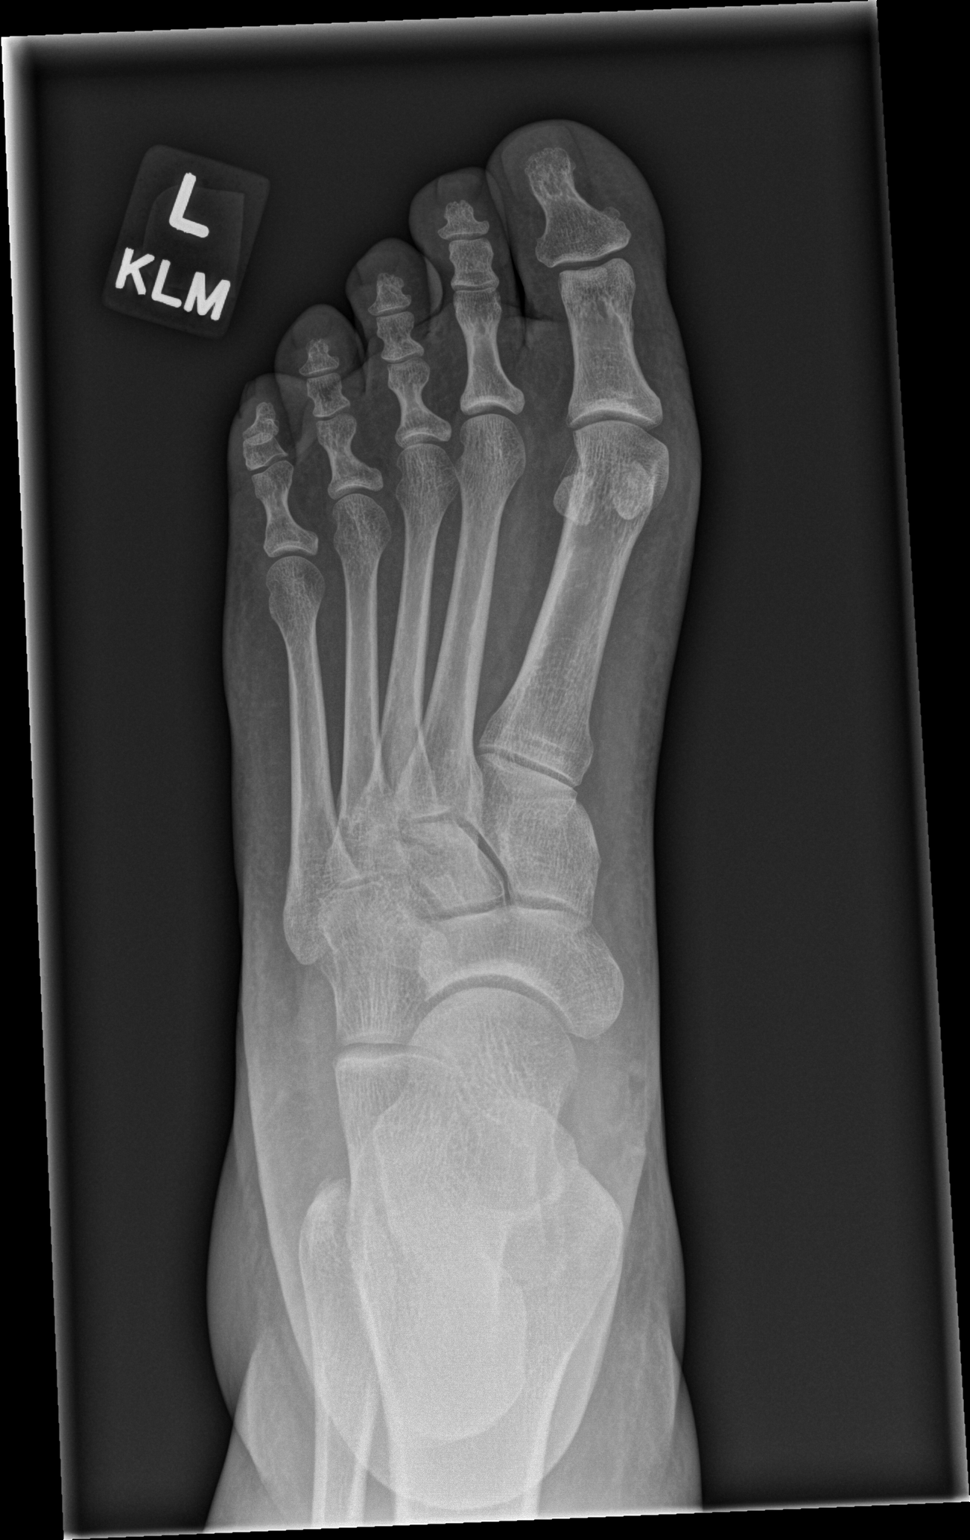

[x foot lat left]
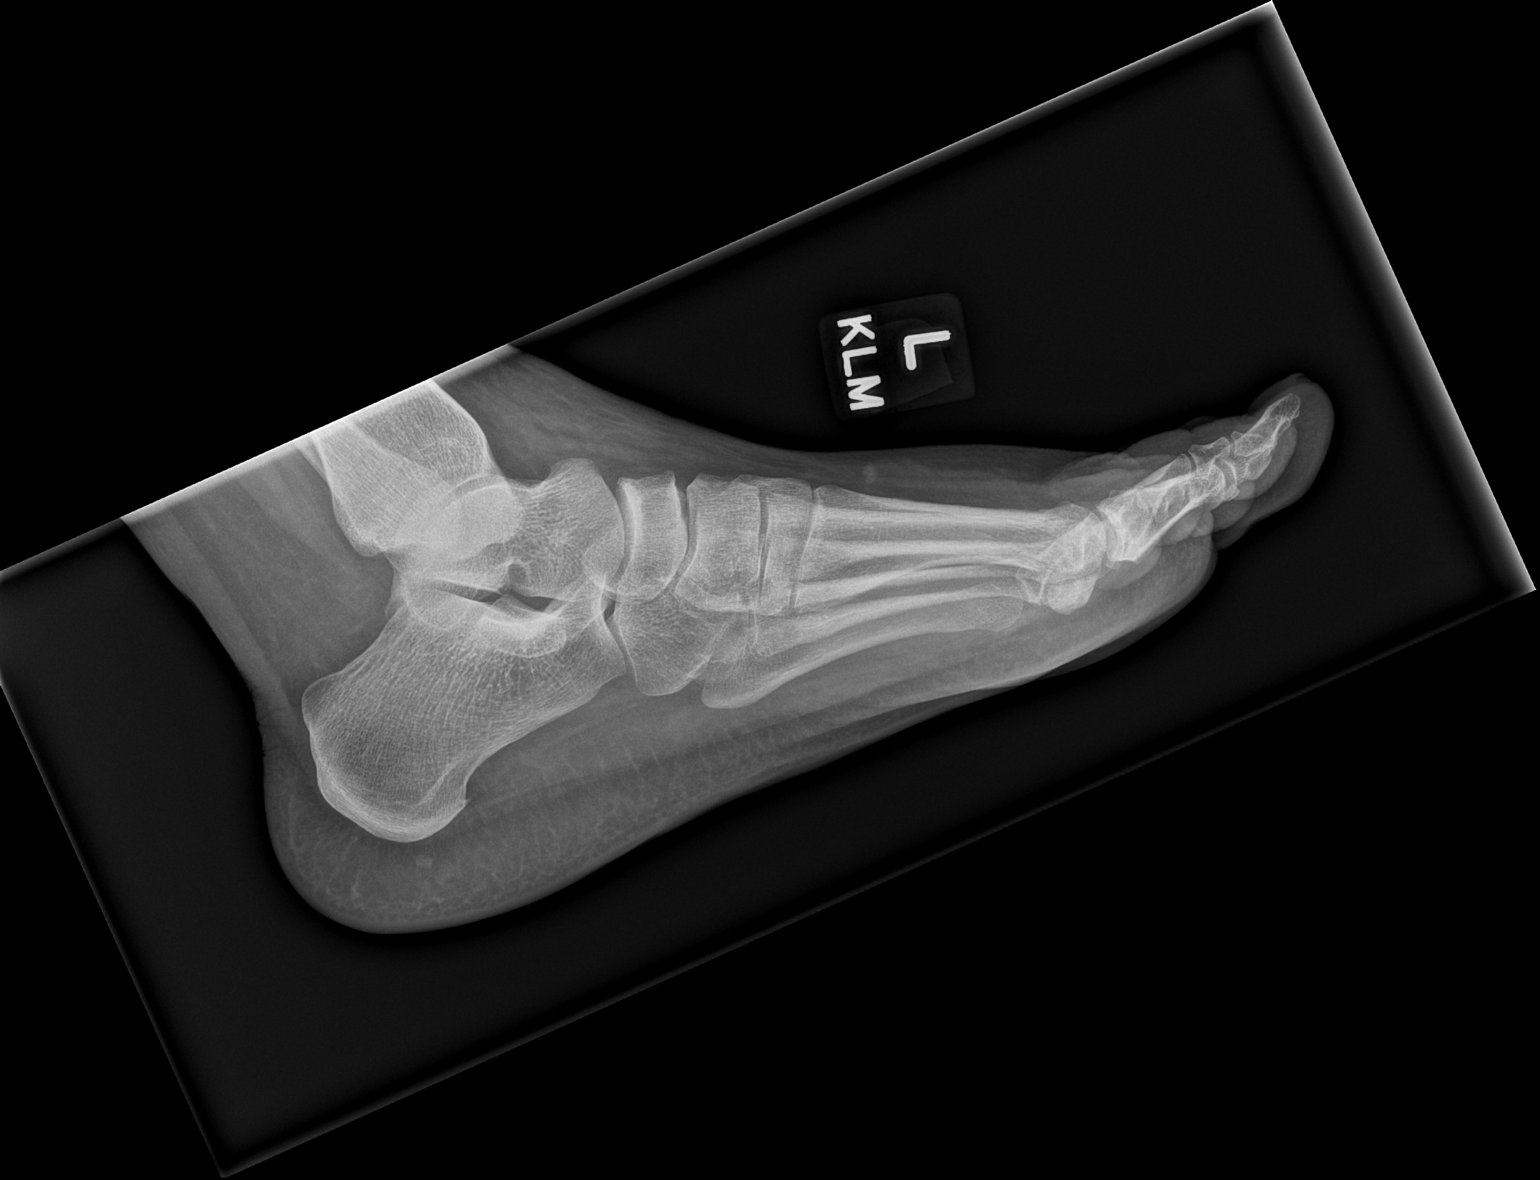

[2 of 2 positions shown; findings below may reference images not displayed]

FINDINGS: There is no evidence of fracture or dislocation. There is no
evidence of arthropathy or other focal bone abnormality. Soft
tissues are unremarkable.
IMPRESSION: Normal exam.

## 2022-10-25 ENCOUNTER — Other Ambulatory Visit (HOSPITAL_COMMUNITY)
Admission: RE | Admit: 2022-10-25 | Discharge: 2022-10-25 | Disposition: A | Discharge status: Home / Self Care | Payer: Managed Care, Other (non HMO) | Source: Ambulatory Visit | Attending: Family Medicine | Admitting: Family Medicine

## 2022-10-25 ENCOUNTER — Other Ambulatory Visit: Payer: Self-pay | Admitting: Family Medicine

## 2022-10-25 DIAGNOSIS — Z01411 Encounter for gynecological examination (general) (routine) with abnormal findings: Secondary | ICD-10-CM | POA: Diagnosis present

## 2022-10-28 LAB — CYTOLOGY - PAP
Comment: NEGATIVE
Diagnosis: NEGATIVE
Diagnosis: REACTIVE
High risk HPV: NEGATIVE

## 2024-01-19 ENCOUNTER — Telehealth: Payer: Self-pay

## 2024-01-19 NOTE — Telephone Encounter (Signed)
 Copied from CRM #[id number]. Topic: Medical Record Request - Other >> Jan 19, 2024 12:05 PM Charlet HERO wrote: Reason for CRM: Patient is calling to see if the allergy records are still on file with our office she was a patient of Dr Theo. Please  contact patient [phone]   Patient is not a patient of this office. YL,RMA

## 2024-01-26 NOTE — Telephone Encounter (Signed)
 Copied from CRM #[id number]. Topic: General - Other >> Jan 26, 2024 12:30 PM Roselie BROCKS wrote:   Reason for RMF:Ejupzwu is calling to see if the allergy records are still on file with our office she was a patient of Dr Theo. Please  contact patient [phone] She needs the records to be faxed to [facility] in [place]   Not a patient of this office

## 2024-02-08 ENCOUNTER — Ambulatory Visit: Admitting: Family Medicine

## 2024-02-23 NOTE — Telephone Encounter (Signed)
 Copied from CRM [id number]. Topic: General - Other >> Jan 24, 2024  9:01 AM Marissa P wrote: Reason for CRM: Patient would like information about her allergy to be emailed to her please [email or web address]
